# Patient Record
Sex: Female | Born: 1970 | Race: Black or African American | Hispanic: No | Marital: Single | Smoking: Never smoker
Health system: Southern US, Community
[De-identification: ages and names within clinical notes are randomized; demographics above are authoritative.]

---

## 2020-05-05 ENCOUNTER — Observation Stay (HOSPITAL_COMMUNITY): Payer: Medicaid Other

## 2020-05-05 ENCOUNTER — Other Ambulatory Visit: Payer: Self-pay

## 2020-05-05 ENCOUNTER — Encounter (HOSPITAL_COMMUNITY): Payer: Self-pay | Admitting: Emergency Medicine

## 2020-05-05 ENCOUNTER — Inpatient Hospital Stay (HOSPITAL_COMMUNITY)
Admission: EM | Admit: 2020-05-05 | Discharge: 2020-05-09 | DRG: 372 | Disposition: A | Discharge status: Home / Self Care | Payer: Medicaid Other | Attending: Infectious Disease | Admitting: Infectious Disease

## 2020-05-05 DIAGNOSIS — R112 Nausea with vomiting, unspecified: Secondary | ICD-10-CM | POA: Diagnosis present

## 2020-05-05 DIAGNOSIS — A020 Salmonella enteritis: Principal | ICD-10-CM | POA: Diagnosis present

## 2020-05-05 DIAGNOSIS — E869 Volume depletion, unspecified: Secondary | ICD-10-CM | POA: Diagnosis present

## 2020-05-05 DIAGNOSIS — Z20822 Contact with and (suspected) exposure to covid-19: Secondary | ICD-10-CM | POA: Diagnosis present

## 2020-05-05 DIAGNOSIS — E872 Acidosis: Secondary | ICD-10-CM | POA: Diagnosis present

## 2020-05-05 DIAGNOSIS — E876 Hypokalemia: Secondary | ICD-10-CM | POA: Diagnosis present

## 2020-05-05 DIAGNOSIS — N179 Acute kidney failure, unspecified: Secondary | ICD-10-CM | POA: Diagnosis not present

## 2020-05-05 DIAGNOSIS — K219 Gastro-esophageal reflux disease without esophagitis: Secondary | ICD-10-CM | POA: Diagnosis present

## 2020-05-05 DIAGNOSIS — Z8711 Personal history of peptic ulcer disease: Secondary | ICD-10-CM

## 2020-05-05 DIAGNOSIS — R197 Diarrhea, unspecified: Secondary | ICD-10-CM | POA: Diagnosis not present

## 2020-05-05 LAB — I-STAT BETA HCG BLOOD, ED (MC, WL, AP ONLY): I-stat hCG, quantitative: <5 m[IU]/mL (ref <5)

## 2020-05-05 LAB — COMPREHENSIVE METABOLIC PANEL
ALT: 20 U/L (ref 0–44)
AST: 39 U/L (ref 15–41)
Albumin: 3.4 g/dL — ABNORMAL LOW (ref 3.5–5.0)
Alkaline Phosphatase: 78 U/L (ref 38–126)
Anion gap: 14 (ref 5–15)
BUN: 28 mg/dL — ABNORMAL HIGH (ref 6–20)
CO2: 16 mmol/L — ABNORMAL LOW (ref 22–32)
Calcium: 9 mg/dL (ref 8.9–10.3)
Chloride: 104 mmol/L (ref 98–111)
Creatinine, Ser: 2.23 mg/dL — ABNORMAL HIGH (ref 0.44–1.00)
GFR calc Af Amer: 29 mL/min — ABNORMAL LOW (ref >60)
GFR calc non Af Amer: 25 mL/min — ABNORMAL LOW (ref >60)
Glucose, Bld: 129 mg/dL — ABNORMAL HIGH (ref 70–99)
Potassium: 3 mmol/L — ABNORMAL LOW (ref 3.5–5.1)
Sodium: 134 mmol/L — ABNORMAL LOW (ref 135–145)
Total Bilirubin: 0.8 mg/dL (ref 0.3–1.2)
Total Protein: 7.9 g/dL (ref 6.5–8.1)

## 2020-05-05 LAB — RAPID URINE DRUG SCREEN, HOSP PERFORMED
Amphetamines: NOT DETECTED
Barbiturates: NOT DETECTED
Benzodiazepines: NOT DETECTED
Cocaine: NOT DETECTED
Opiates: NOT DETECTED
Tetrahydrocannabinol: POSITIVE — AB

## 2020-05-05 LAB — URINALYSIS, ROUTINE W REFLEX MICROSCOPIC
Bacteria, UA: NONE SEEN
Bilirubin Urine: NEGATIVE
Glucose, UA: 50 mg/dL — AB
Ketones, ur: 20 mg/dL — AB
Leukocytes,Ua: NEGATIVE
Nitrite: NEGATIVE
Protein, ur: 100 mg/dL — AB
RBC / HPF: >50 RBC/hpf — ABNORMAL HIGH (ref 0–5)
Specific Gravity, Urine: 1.018 (ref 1.005–1.030)
pH: 6 (ref 5.0–8.0)

## 2020-05-05 LAB — LIPASE, BLOOD: Lipase: 23 U/L (ref 11–51)

## 2020-05-05 LAB — CBC
HCT: 43.7 % (ref 36.0–46.0)
Hemoglobin: 14.7 g/dL (ref 12.0–15.0)
MCH: 31 pg (ref 26.0–34.0)
MCHC: 33.6 g/dL (ref 30.0–36.0)
MCV: 92.2 fL (ref 80.0–100.0)
Platelets: 243 10*3/uL (ref 150–400)
RBC: 4.74 MIL/uL (ref 3.87–5.11)
RDW: 13.3 % (ref 11.5–15.5)
WBC: 10.8 10*3/uL — ABNORMAL HIGH (ref 4.0–10.5)
nRBC: 0 % (ref 0.0–0.2)

## 2020-05-05 LAB — C DIFFICILE QUICK SCREEN W PCR REFLEX
C Diff antigen: NEGATIVE
C Diff interpretation: NOT DETECTED
C Diff toxin: NEGATIVE

## 2020-05-05 LAB — SARS CORONAVIRUS 2 BY RT PCR (HOSPITAL ORDER, PERFORMED IN ~~LOC~~ HOSPITAL LAB): SARS Coronavirus 2: NEGATIVE

## 2020-05-05 MED ORDER — LACTATED RINGERS IV BOLUS
1000.0000 mL | Freq: Once | INTRAVENOUS | Status: AC
  Administered 2020-05-05: 1000 mL via INTRAVENOUS

## 2020-05-05 MED ORDER — POTASSIUM CHLORIDE 10 MEQ/100ML IV SOLN
10.0000 meq | Freq: Once | INTRAVENOUS | Status: AC
  Administered 2020-05-05: 10 meq via INTRAVENOUS
  Filled 2020-05-05: qty 100

## 2020-05-05 MED ORDER — OXYCODONE HCL 5 MG PO TABS
5.0000 mg | ORAL_TABLET | ORAL | Status: DC | PRN
  Administered 2020-05-05 – 2020-05-09 (×5): 5 mg via ORAL
  Filled 2020-05-05 (×5): qty 1

## 2020-05-05 MED ORDER — ONDANSETRON HCL 4 MG/2ML IJ SOLN
4.0000 mg | Freq: Once | INTRAMUSCULAR | Status: AC
  Administered 2020-05-05: 4 mg via INTRAVENOUS
  Filled 2020-05-05: qty 2

## 2020-05-05 MED ORDER — SODIUM CHLORIDE 0.9% FLUSH
3.0000 mL | Freq: Once | INTRAVENOUS | Status: AC
  Administered 2020-05-05: 3 mL via INTRAVENOUS

## 2020-05-05 MED ORDER — ACETAMINOPHEN 650 MG RE SUPP: 650.0000 mg | Freq: Four times a day (QID) | RECTAL | Status: DC | PRN

## 2020-05-05 MED ORDER — ONDANSETRON HCL 4 MG PO TABS
4.0000 mg | ORAL_TABLET | Freq: Four times a day (QID) | ORAL | Status: DC | PRN
  Administered 2020-05-06 – 2020-05-07 (×2): 4 mg via ORAL
  Filled 2020-05-05 (×3): qty 1

## 2020-05-05 MED ORDER — DICYCLOMINE HCL 10 MG PO CAPS
10.0000 mg | ORAL_CAPSULE | Freq: Once | ORAL | Status: AC
  Administered 2020-05-05: 10 mg via ORAL
  Filled 2020-05-05: qty 1

## 2020-05-05 MED ORDER — SODIUM CHLORIDE 0.45 % IV SOLN
INTRAVENOUS | Status: AC
  Filled 2020-05-05 (×9): qty 1000

## 2020-05-05 MED ORDER — ACETAMINOPHEN 325 MG PO TABS
650.0000 mg | ORAL_TABLET | Freq: Four times a day (QID) | ORAL | Status: DC | PRN
  Administered 2020-05-06 – 2020-05-08 (×5): 650 mg via ORAL
  Filled 2020-05-05 (×4): qty 2

## 2020-05-05 MED ORDER — LOPERAMIDE HCL 2 MG PO CAPS
2.0000 mg | ORAL_CAPSULE | ORAL | Status: DC | PRN
  Administered 2020-05-06 – 2020-05-07 (×4): 2 mg via ORAL
  Filled 2020-05-05 (×4): qty 1

## 2020-05-05 MED ORDER — ONDANSETRON HCL 4 MG/2ML IJ SOLN
4.0000 mg | Freq: Four times a day (QID) | INTRAMUSCULAR | Status: DC | PRN
  Administered 2020-05-08: 4 mg via INTRAVENOUS
  Filled 2020-05-05: qty 2

## 2020-05-05 NOTE — Progress Notes (Signed)
New Admission Note:   Arrival Method: Arrived from ED via stretcher Mental Orientation: Alert and oriented x4 Telemetry: Box #9 Assessment: Completed Skin: Intact IV: Rt AC Pain: Denies Tubes: N/A Safety Measures: Safety Fall Prevention Plan has been discussed.  Admission: Completed Orientation: Patient has been orientated to the room, unit and staff.  Family: None at bedside  Orders have been reviewed and implemented. Will continue to monitor the patient. Call light has been placed within reach and bed alarm has been activated.   Rex Oesterle [employer], RN-BC Phone number: [phone]

## 2020-05-05 NOTE — H&P (Addendum)
History and Physical    Hannalee Castor WUJ:[id number] DOB: 05-25-1971 DOA: 05/05/2020  PCP: System, Pcp Not In   Patient coming from: Home  I have personally briefly reviewed patient's old medical records in [place]  Chief Complaint: Diarrhea nauseous vomiting  HPI: Sophia Elliott is a 49 y.o. female with medical history significant of GERD, remote history of peptic ulcer with positive H. pylori status post treatment, presented with acute onset of nauseous vomit, abdominal cramping and diarrhea for 3 days.  Patient lives in [place] and went to [place] on weekend.  On Tuesday, about 1 hour after lunch when she ate a chicken gyro, she started to feel epigastric pain and soon she started to have diarrhea watery and she continues to have watery diarrhea through the day and night.  And episodic of abdominal cramping, she denies any tenesmus, and she had some feeling of chills and broke into sweating. Vomiting many times and could not eat or drink very well since strong feeling of nausea continued. She started to take OTC Imodium since yesterday with some what improvement of diarrhea and she had 2 diarrhea today so far. Abd pain also better but she still feels very weak and could hardly eat or drink anything. ED Course: AKI of Cr 2.2.  Review of Systems: As per HPI otherwise 10 point review of systems negative.    Past medical stress mentioned in HPI  Past surgical history: Lumbar spine surgery  Social history, denied any smoking drinking or drug use    No Known Allergies  Family history, denies any chronic problem such as diabetes in the family  Prior to Admission medications   Not on File    Physical Exam: Vitals:   05/05/20 1529 05/05/20 1530 05/05/20 1531 05/05/20 1700  BP:    110/79  Pulse: 96 98 91 86  Resp:      Temp:      TempSrc:      SpO2: 100% 99% 100% 97%  Weight:      Height:        Constitutional: NAD, calm, comfortable Vitals:   05/05/20  1529 05/05/20 1530 05/05/20 1531 05/05/20 1700  BP:    110/79  Pulse: 96 98 91 86  Resp:      Temp:      TempSrc:      SpO2: 100% 99% 100% 97%  Weight:      Height:       Eyes: PERRL, lids and conjunctivae normal ENMT: Mucous membranes are dry. Posterior pharynx clear of any exudate or lesions.Normal dentition.  Neck: normal, supple, no masses, no thyromegaly Respiratory: clear to auscultation bilaterally, no wheezing, no crackles. Normal respiratory effort. No accessory muscle use.  Cardiovascular: Regular rate and rhythm, no murmurs / rubs / gallops. No extremity edema. 2+ pedal pulses. No carotid bruits.  Abdomen: Mild tenderness on periumbilical area. No hepatosplenomegaly. Bowel sounds positive.  Musculoskeletal: no clubbing / cyanosis. No joint deformity upper and lower extremities. Good ROM, no contractures. Normal muscle tone.  Skin: no rashes, lesions, ulcers. No induration Neurologic: CN 2-12 grossly intact. Sensation intact, DTR normal. Strength 5/5 in all 4.  Psychiatric: Normal judgment and insight. Alert and oriented x 3. Normal mood.     Labs on Admission: I have personally reviewed following labs and imaging studies  CBC: Recent Labs  Lab 05/05/20 1000  WBC 10.8*  HGB 14.7  HCT 43.7  MCV 92.2  PLT 956   Basic Metabolic  Panel: Recent Labs  Lab 05/05/20 1000  NA 134*  K 3.0*  CL 104  CO2 16*  GLUCOSE 129*  BUN 28*  CREATININE 2.23*  CALCIUM 9.0   GFR: Estimated Creatinine Clearance: 37.4 mL/min (A) (by C-G formula based on SCr of 2.23 mg/dL (H)). Liver Function Tests: Recent Labs  Lab 05/05/20 1000  AST 39  ALT 20  ALKPHOS 78  BILITOT 0.8  PROT 7.9  ALBUMIN 3.4*   Recent Labs  Lab 05/05/20 1000  LIPASE 23   No results for input(s): AMMONIA in the last 168 hours. Coagulation Profile: No results for input(s): INR, PROTIME in the last 168 hours. Cardiac Enzymes: No results for input(s): CKTOTAL, CKMB, CKMBINDEX, TROPONINI in the last  168 hours. BNP (last 3 results) No results for input(s): PROBNP in the last 8760 hours. HbA1C: No results for input(s): HGBA1C in the last 72 hours. CBG: No results for input(s): GLUCAP in the last 168 hours. Lipid Profile: No results for input(s): CHOL, HDL, LDLCALC, TRIG, CHOLHDL, LDLDIRECT in the last 72 hours. Thyroid Function Tests: No results for input(s): TSH, T4TOTAL, FREET4, T3FREE, THYROIDAB in the last 72 hours. Anemia Panel: No results for input(s): VITAMINB12, FOLATE, FERRITIN, TIBC, IRON, RETICCTPCT in the last 72 hours. Urine analysis:    Component Value Date/Time   COLORURINE AMBER (A) 05/05/2020 1516   APPEARANCEUR CLOUDY (A) 05/05/2020 1516   LABSPEC 1.018 05/05/2020 1516   PHURINE 6.0 05/05/2020 1516   GLUCOSEU 50 (A) 05/05/2020 1516   HGBUR MODERATE (A) 05/05/2020 1516   BILIRUBINUR NEGATIVE 05/05/2020 1516   KETONESUR 20 (A) 05/05/2020 1516   PROTEINUR 100 (A) 05/05/2020 1516   NITRITE NEGATIVE 05/05/2020 1516   LEUKOCYTESUR NEGATIVE 05/05/2020 1516    Radiological Exams on Admission: No results found.  EKG: NOne  Assessment/Plan Active Problems:   AKI (acute kidney injury) (HCC)  AKI with non-anion gap metabolic acidosis -From severe volume depletion of ongoing diarrhea nauseous vomit, received 2 IV boluses in the ED, will start patient on the bicarb drip to correct bicarb level. Hopefully not a ATN. -Check renal ultrasound -BMP in the morning -Expect patient kidney function improved in next 24 hours and if her diarrhea also stabilized and p.o. picks up she likely can be discharged tomorrow.  Gastroenteritis -With both upper and lower GI symptoms with abdominal cramping, and acute onset, suspect food toxin, C diff ruled out. -No fever and borderline WBC elevation, and clinically she has had improved with less episodes of diarrhea, will hold off ABX and monitor CBC in AM. -IVF 150 ml/hr and PRN Imodium  Hypokalemia -From acidosis and continued GI  loss, replace and recheck   DVT prophylaxis: SCD Code Status: Full Family Communication: None Disposition Plan: Discharge barrier depends on patient clinical response to IV fluid and other supportive care and kidney function improvement, expect discharge in next 24 hours Consults called: None Admission status: Floor obs   Emeline General MD [facility] Pager [phone]   05/05/2020, 5:41 PM

## 2020-05-05 NOTE — Plan of Care (Signed)
  Problem: Activity: Goal: Risk for activity intolerance will decrease Outcome: Progressing   

## 2020-05-05 NOTE — ED Provider Notes (Signed)
[facility] EMERGENCY DEPARTMENT Provider Note   CSN: [id number] Arrival date & time: 05/05/20  0539     History Chief Complaint  Patient presents with  . Diarrhea  . Emesis    Sophia Elliott is a 49 y.o. female with no significant PMH who presents with N/V/D and abdominal pain. She states that she went to [place] with her boyfriend and ate a chicken gyro on Tuesday. Soon afterwards she started to have diarrhea and chills. The diarrhea is severe and watery. She cannot quantify how much she is having. Sometimes she can't make to the bathroom and so she's been wearing adult diapers. She has also had several episodes of vomiting but constant nausea. She threw up 3 times yesterday and once today. She has generalized abdominal cramping. She is taking Immodium and Pepcid without relief. No recent antibiotics. No URI symptoms, cough, chest pain or SOB. She is urinating normally. She felt so bad today due to fatigue and weakness she decided to come to the ED. She lives in [place] but her boyfriend lives here.   HPI     History reviewed. No pertinent past medical history.  There are no problems to display for this patient.   History reviewed. No pertinent surgical history.   OB History   No obstetric history on file.     No family history on file.  Social History   Tobacco Use  . Smoking status: Not on file  Substance Use Topics  . Alcohol use: Not on file  . Drug use: Not on file    Home Medications Prior to Admission medications   Not on File    Allergies    Patient has no known allergies.  Review of Systems   Review of Systems  Constitutional: Positive for activity change, appetite change, chills and fatigue. Negative for fever.  HENT: Negative for rhinorrhea and sore throat.   Respiratory: Negative for shortness of breath.   Cardiovascular: Negative for chest pain.  Gastrointestinal: Positive for abdominal pain, diarrhea, nausea and vomiting.    Genitourinary: Negative for dysuria and flank pain.  Neurological: Positive for light-headedness. Negative for syncope and headaches.  All other systems reviewed and are negative.   Physical Exam Updated Vital Signs BP 111/75   Pulse 96   Temp 97.7 F (36.5 C)   Resp 16   Ht 5\' 5"  (1.651 m)   Wt 104.3 kg   SpO2 99%   BMI 38.27 kg/m   Physical Exam Vitals and nursing note reviewed.  Constitutional:      General: She is not in acute distress.    Appearance: She is well-developed. She is obese. She is not ill-appearing.     Comments: Calm, cooperative. NAD  HENT:     Head: Normocephalic and atraumatic.  Eyes:     General: No scleral icterus.       Right eye: No discharge.        Left eye: No discharge.     Conjunctiva/sclera: Conjunctivae normal.     Pupils: Pupils are equal, round, and reactive to light.  Cardiovascular:     Rate and Rhythm: Regular rhythm. Tachycardia present.  Pulmonary:     Effort: Pulmonary effort is normal. No respiratory distress.     Breath sounds: Normal breath sounds.  Abdominal:     General: Bowel sounds are increased. There is no distension.     Palpations: Abdomen is soft.     Tenderness: There is no abdominal tenderness.  Musculoskeletal:     Cervical back: Normal range of motion.  Skin:    General: Skin is warm and dry.  Neurological:     Mental Status: She is alert and oriented to person, place, and time.  Psychiatric:        Behavior: Behavior normal.     ED Results / Procedures / Treatments   Labs (all labs ordered are listed, but only abnormal results are displayed) Labs Reviewed  COMPREHENSIVE METABOLIC PANEL - Abnormal; Notable for the following components:      Result Value   Sodium 134 (*)    Potassium 3.0 (*)    CO2 16 (*)    Glucose, Bld 129 (*)    BUN 28 (*)    Creatinine, Ser 2.23 (*)    Albumin 3.4 (*)    GFR calc non Af Amer 25 (*)    GFR calc Af Amer 29 (*)    All other components within normal limits   CBC - Abnormal; Notable for the following components:   WBC 10.8 (*)    All other components within normal limits  LIPASE, BLOOD  URINALYSIS, ROUTINE W REFLEX MICROSCOPIC  I-STAT BETA HCG BLOOD, ED (MC, WL, AP ONLY)    EKG None  Radiology No results found.  Procedures Procedures (including critical care time)  Medications Ordered in ED Medications  sodium chloride flush (NS) 0.9 % injection 3 mL (3 mLs Intravenous Given 05/05/20 1409)  lactated ringers bolus 1,000 mL (0 mLs Intravenous Stopped 05/05/20 1409)  ondansetron (ZOFRAN) injection 4 mg (4 mg Intravenous Given 05/05/20 1302)  potassium chloride 10 mEq in 100 mL IVPB (0 mEq Intravenous Stopped 05/05/20 1409)  lactated ringers bolus 1,000 mL (0 mLs Intravenous Stopped 05/05/20 1518)  dicyclomine (BENTYL) capsule 10 mg (10 mg Oral Given 05/05/20 1425)    ED Course  I have reviewed the triage vital signs and the nursing notes.  Pertinent labs & imaging results that were available during my care of the patient were reviewed by me and considered in my medical decision making (see chart for details).  49 year old female presents with nausea, vomiting, diarrhea and abdominal cramping after eating a chicken euro 3 days ago.  She has had profuse diarrhea and has not been able to take p.o.  She is alert and oriented and overall well-appearing although she does not feel well.  She is mildly tachycardic on initial exam.  CBC shows mild leukocytosis.  CMP shows hypokalemia, and acute renal failure (SCr 2.2). Will give fluids, Zofran, potassium  UA has 20 ketones. C diff is negative.  On recheck patient is able to tolerate a small amount of p.o. but it increases her abdominal cramping.  Due to concern that she will be able to rehydrate at home will request admission for observation.  Discussed with Dr. Chipper Herb with [facility] who will admit.  MDM Rules/Calculators/A&P                      Final Clinical Impression(s) / ED Diagnoses Final  diagnoses:  Nausea vomiting and diarrhea  AKI (acute kidney injury) (HCC)  Hypokalemia    Rx / DC Orders ED Discharge Orders    None       Bethel Born, PA-C 05/05/20 1637    Sabas Sous, MD 05/10/20 1146

## 2020-05-05 NOTE — ED Triage Notes (Signed)
Pt reports on Tuesday she ate a chicken sand which while visiting in [place]- pt states since then she has had constant abd cramping with diarrhea and vomiting. Pt states she is currently wearing depends due to constant diarrhea.  Pt now having body aching and cramping.

## 2020-05-05 NOTE — ED Notes (Signed)
Pt transported to US

## 2020-05-06 DIAGNOSIS — A020 Salmonella enteritis: Secondary | ICD-10-CM | POA: Diagnosis present

## 2020-05-06 DIAGNOSIS — E876 Hypokalemia: Secondary | ICD-10-CM | POA: Diagnosis present

## 2020-05-06 DIAGNOSIS — A029 Salmonella infection, unspecified: Secondary | ICD-10-CM | POA: Diagnosis not present

## 2020-05-06 DIAGNOSIS — N179 Acute kidney failure, unspecified: Secondary | ICD-10-CM | POA: Diagnosis present

## 2020-05-06 DIAGNOSIS — E872 Acidosis: Secondary | ICD-10-CM | POA: Diagnosis present

## 2020-05-06 DIAGNOSIS — R112 Nausea with vomiting, unspecified: Secondary | ICD-10-CM | POA: Diagnosis not present

## 2020-05-06 DIAGNOSIS — K219 Gastro-esophageal reflux disease without esophagitis: Secondary | ICD-10-CM | POA: Diagnosis present

## 2020-05-06 DIAGNOSIS — Z8711 Personal history of peptic ulcer disease: Secondary | ICD-10-CM | POA: Diagnosis not present

## 2020-05-06 DIAGNOSIS — R197 Diarrhea, unspecified: Secondary | ICD-10-CM | POA: Diagnosis not present

## 2020-05-06 DIAGNOSIS — E869 Volume depletion, unspecified: Secondary | ICD-10-CM | POA: Diagnosis present

## 2020-05-06 DIAGNOSIS — Z20822 Contact with and (suspected) exposure to covid-19: Secondary | ICD-10-CM | POA: Diagnosis present

## 2020-05-06 LAB — BASIC METABOLIC PANEL
Anion gap: 10 (ref 5–15)
Anion gap: 10 (ref 5–15)
BUN: 24 mg/dL — ABNORMAL HIGH (ref 6–20)
BUN: 18 mg/dL (ref 6–20)
CO2: 23 mmol/L (ref 22–32)
CO2: 25 mmol/L (ref 22–32)
Calcium: 8.2 mg/dL — ABNORMAL LOW (ref 8.9–10.3)
Calcium: 8.2 mg/dL — ABNORMAL LOW (ref 8.9–10.3)
Chloride: 101 mmol/L (ref 98–111)
Chloride: 99 mmol/L (ref 98–111)
Creatinine, Ser: 1.67 mg/dL — ABNORMAL HIGH (ref 0.44–1.00)
Creatinine, Ser: 1.59 mg/dL — ABNORMAL HIGH (ref 0.44–1.00)
GFR calc Af Amer: 42 mL/min — ABNORMAL LOW (ref >60)
GFR calc Af Amer: 44 mL/min — ABNORMAL LOW (ref >60)
GFR calc non Af Amer: 36 mL/min — ABNORMAL LOW (ref >60)
GFR calc non Af Amer: 38 mL/min — ABNORMAL LOW (ref >60)
Glucose, Bld: 106 mg/dL — ABNORMAL HIGH (ref 70–99)
Glucose, Bld: 94 mg/dL (ref 70–99)
Potassium: 2.4 mmol/L — CL (ref 3.5–5.1)
Potassium: 2.7 mmol/L — CL (ref 3.5–5.1)
Sodium: 134 mmol/L — ABNORMAL LOW (ref 135–145)
Sodium: 134 mmol/L — ABNORMAL LOW (ref 135–145)

## 2020-05-06 LAB — CBC
HCT: 33.9 % — ABNORMAL LOW (ref 36.0–46.0)
Hemoglobin: 11.8 g/dL — ABNORMAL LOW (ref 12.0–15.0)
MCH: 31.6 pg (ref 26.0–34.0)
MCHC: 34.8 g/dL (ref 30.0–36.0)
MCV: 90.9 fL (ref 80.0–100.0)
Platelets: 215 10*3/uL (ref 150–400)
RBC: 3.73 MIL/uL — ABNORMAL LOW (ref 3.87–5.11)
RDW: 13.3 % (ref 11.5–15.5)
WBC: 7.2 10*3/uL (ref 4.0–10.5)
nRBC: 0 % (ref 0.0–0.2)

## 2020-05-06 LAB — PHOSPHORUS: Phosphorus: 2.3 mg/dL — ABNORMAL LOW (ref 2.5–4.6)

## 2020-05-06 LAB — HIV ANTIBODY (ROUTINE TESTING W REFLEX): HIV Screen 4th Generation wRfx: NONREACTIVE

## 2020-05-06 LAB — MAGNESIUM: Magnesium: 1.8 mg/dL (ref 1.7–2.4)

## 2020-05-06 MED ORDER — POTASSIUM CHLORIDE CRYS ER 20 MEQ PO TBCR
40.0000 meq | EXTENDED_RELEASE_TABLET | ORAL | Status: DC
  Administered 2020-05-06: 40 meq via ORAL
  Filled 2020-05-06: qty 2

## 2020-05-06 MED ORDER — POTASSIUM CHLORIDE CRYS ER 20 MEQ PO TBCR: 40.0000 meq | EXTENDED_RELEASE_TABLET | Freq: Once | ORAL | Status: DC

## 2020-05-06 MED ORDER — POTASSIUM CHLORIDE CRYS ER 20 MEQ PO TBCR
40.0000 meq | EXTENDED_RELEASE_TABLET | ORAL | Status: AC
  Administered 2020-05-06 (×2): 40 meq via ORAL
  Filled 2020-05-06: qty 2

## 2020-05-06 MED ORDER — POTASSIUM CHLORIDE CRYS ER 20 MEQ PO TBCR
40.0000 meq | EXTENDED_RELEASE_TABLET | Freq: Once | ORAL | Status: AC
  Administered 2020-05-06: 40 meq via ORAL
  Filled 2020-05-06: qty 2

## 2020-05-06 NOTE — Progress Notes (Signed)
PROGRESS NOTE    Sophia Elliott  OVZ:[id number] DOB: 01/17/1971 DOA: 05/05/2020 PCP: System, Pcp Not In    Chief Complaint  Patient presents with  . Diarrhea  . Emesis    Brief Narrative:  Sophia Elliott is a 49 y.o. female with medical history significant of GERD, remote history of peptic ulcer with positive H. pylori status post treatment, presented with acute onset of nauseous vomit, abdominal cramping and diarrhea for 3 days.  Patient lives in [place] and went to [place] on weekend.  On Tuesday, about 1 hour after lunch when she ate a chicken gyro, she started to feel epigastric pain and soon she started to have diarrhea watery and she continued to have watery diarrhea through the day and night.  And episodic of abdominal cramping, she denies any tenesmus, and she had some feeling of chills and broke into sweating.  She was unable to tolerate any p.o.  Abdominal pain improved but she continued to have diarrhea.  Patient states she lost count of how many stools she had last night.  Denies having any blood in the stool.  ED Course: AKI of Cr 2.2.   Assessment & Plan:   Active Problems:   AKI (acute kidney injury) (HCC)   Nausea vomiting and diarrhea  AKI with non-anion gap metabolic acidosis -From severe volume depletion of ongoing diarrhea, vomiting. Received 2 IV boluses in the ED.  She was given IV bicarb. -Renal ultrasound unremarkable. -Continue IV hydration.  Continue to monitor BMP.  Avoid nephrotoxic medications.  BMP in the morning  Gastroenteritis -With both upper and lower GI symptoms with abdominal cramping, and acute onset, suspect food toxin, C diff ruled out.  GI pathogen panel pending at this time. -No fever and borderline WBC elevation.  Hold off on antibiotics at this time.  Hypokalemia -From acidosis and continued GI loss.  Replacement ordered.  Continue to monitor and replace as needed.  Would also check magnesium level.   DVT prophylaxis:  SCD Code Status: Full Family Communication: None Disposition Plan: Discharge barrier depends on patient clinical response to IV fluid and other supportive care and kidney function improvement Consults called: None Status is: Inpatient  Remains inpatient appropriate because:Persistent severe electrolyte disturbances and IV treatments appropriate due to intensity of illness or inability to take PO   Dispo: The patient is from: Home              Anticipated d/c is to: Home              Anticipated d/c date is: 2 days              Patient currently is not medically stable to d/c.   Consultants:   None   Procedures:  None   Antimicrobials:   None    Subjective: She says she is continuing to have diarrhea and she lost count of how many bowel movements she had last night.  She has some nausea but no vomiting.  Potassium this morning 2.7.  Objective: Vitals:   05/05/20 2012 05/06/20 0031 05/06/20 0523 05/06/20 0814  BP:  102/66 123/71 104/66  Pulse:  84 88 85  Resp:  16 16 18   Temp:  98.6 F (37 C) 98.8 F (37.1 C) 98.9 F (37.2 C)  TempSrc:      SpO2:  100% 98% 100%  Weight: 103.8 kg     Height: 5\' 5"  (1.651 m)       Intake/Output Summary (Last  24 hours) at 05/06/2020 1620 Last data filed at 05/06/2020 1300 Gross per 24 hour  Intake 2704.96 ml  Output 12 ml  Net 2692.96 ml   Filed Weights   05/05/20 0948 05/05/20 2012  Weight: 104.3 kg 103.8 kg    Examination:  General exam: Appears calm and comfortable  Respiratory system: Clear to auscultation. Respiratory effort normal. Cardiovascular system: S1 & S2 heard, RRR. No JVD, murmurs, rubs, gallops or clicks. No pedal edema. Gastrointestinal system: Abdomen is nondistended, mild nonspecific tenderness without any guarding or rebound. No organomegaly or masses felt. Normal bowel sounds heard. Central nervous system: Alert and oriented. No focal neurological deficits. Extremities: Symmetric 5 x 5 power. Skin: No  rashes, lesions or ulcers Psychiatry: Judgement and insight appear normal. Mood & affect appropriate.     Data Reviewed: I have personally reviewed following labs and imaging studies  CBC: Recent Labs  Lab 05/05/20 1000 05/06/20 0722  WBC 10.8* 7.2  HGB 14.7 11.8*  HCT 43.7 33.9*  MCV 92.2 90.9  PLT 243 215    Basic Metabolic Panel: Recent Labs  Lab 05/05/20 1000 05/06/20 0722  NA 134* 134*  K 3.0* 2.4*  CL 104 101  CO2 16* 23  GLUCOSE 129* 106*  BUN 28* 24*  CREATININE 2.23* 1.67*  CALCIUM 9.0 8.2*  MG  --  1.8  PHOS  --  2.3*    GFR: Estimated Creatinine Clearance: 49.8 mL/min (A) (by C-G formula based on SCr of 1.67 mg/dL (H)).  Liver Function Tests: Recent Labs  Lab 05/05/20 1000  AST 39  ALT 20  ALKPHOS 78  BILITOT 0.8  PROT 7.9  ALBUMIN 3.4*    CBG: No results for input(s): GLUCAP in the last 168 hours.   Recent Results (from the past 240 hour(s))  C Difficile Quick Screen w PCR reflex     Status: None   Collection Time: 05/05/20  3:15 PM   Specimen: Stool  Result Value Ref Range Status   C Diff antigen NEGATIVE NEGATIVE Final   C Diff toxin NEGATIVE NEGATIVE Final   C Diff interpretation No C. difficile detected.  Final    Comment: Performed at [facility], [address] [place], [place] [address]  SARS Coronavirus 2 by RT PCR (hospital order, performed in [facility] hospital lab) Nasopharyngeal Nasopharyngeal Swab     Status: None   Collection Time: 05/05/20  4:50 PM   Specimen: Nasopharyngeal Swab  Result Value Ref Range Status   SARS Coronavirus 2 NEGATIVE NEGATIVE Final    Comment: (NOTE) SARS-CoV-2 target nucleic acids are NOT DETECTED. The SARS-CoV-2 RNA is generally detectable in upper and lower respiratory specimens during the acute phase of infection. The lowest concentration of SARS-CoV-2 viral copies this assay can detect is 250 copies / mL. A negative result does not preclude SARS-CoV-2 infection and should not be  used as the sole basis for treatment or other patient management decisions.  A negative result may occur with improper specimen collection / handling, submission of specimen other than nasopharyngeal swab, presence of viral mutation(s) within the areas targeted by this assay, and inadequate number of viral copies (<250 copies / mL). A negative result must be combined with clinical observations, patient history, and epidemiological information. Fact Sheet for Patients:   BoilerBrush.com.cy Fact Sheet for Healthcare Providers: https://pope.com/ This test is not yet approved or cleared  by the [place] FDA and has been authorized for detection and/or diagnosis of SARS-CoV-2 by FDA  under an Emergency Use Authorization (EUA).  This EUA will remain in effect (meaning this test can be used) for the duration of the COVID-19 declaration under Section 564(b)(1) of the Act, 21 U.S.C. section 360bbb-3(b)(1), unless the authorization is terminated or revoked sooner. Performed at [facility], [place] [address], [place], [place] [address]          Radiology Studies: US RENAL  Result Date: 05/05/2020 CLINICAL DATA:  Acute renal insufficiency EXAM: RENAL / URINARY TRACT ULTRASOUND COMPLETE COMPARISON:  None. FINDINGS: Right Kidney: Renal measurements: 11.8 x 5.3 by 6.0 cm = volume: 197 mL . Echogenicity within normal limits. No mass or hydronephrosis visualized. Left Kidney: Renal measurements: 11.4 x 7.4 x 7.2 cm = volume: 317 mL. Echogenicity within normal limits. No mass or hydronephrosis visualized. Bladder: Appears normal for degree of bladder distention. Other: None. IMPRESSION: 1. Unremarkable renal ultrasound. Electronically Signed   By: Randa Ngo M.D.   On: 05/05/2020 19:22        Scheduled Meds: Continuous Infusions: . sodium chloride 0.45 % 1,000 mL with sodium bicarbonate 75 mEq infusion 150 mL/hr at 05/06/20 1134      LOS: 0 days    Yaakov Guthrie, MD [facility]   To contact the attending provider between 7A-7P or the covering provider during after hours 7P-7A, please log into the web site [email or web address] and access using universal [place] password for that web site. If you do not have the password, please call the hospital operator.  05/06/2020, 4:20 PM

## 2020-05-06 NOTE — Progress Notes (Signed)
CRITICAL VALUE ALERT  Critical Value:  2.7  Date & Time Notied:05/06/20 @1625   Provider Notified: Dr 

## 2020-05-07 DIAGNOSIS — A029 Salmonella infection, unspecified: Secondary | ICD-10-CM

## 2020-05-07 LAB — GASTROINTESTINAL PANEL BY PCR, STOOL (REPLACES STOOL CULTURE)

## 2020-05-07 LAB — MAGNESIUM: Magnesium: 1.9 mg/dL (ref 1.7–2.4)

## 2020-05-07 LAB — CBC
HCT: 32.3 % — ABNORMAL LOW (ref 36.0–46.0)
Hemoglobin: 11.1 g/dL — ABNORMAL LOW (ref 12.0–15.0)
MCH: 31.6 pg (ref 26.0–34.0)
MCHC: 34.4 g/dL (ref 30.0–36.0)
MCV: 92 fL (ref 80.0–100.0)
Platelets: 216 10*3/uL (ref 150–400)
RBC: 3.51 MIL/uL — ABNORMAL LOW (ref 3.87–5.11)
RDW: 13.3 % (ref 11.5–15.5)
WBC: 5.4 10*3/uL (ref 4.0–10.5)
nRBC: 0 % (ref 0.0–0.2)

## 2020-05-07 LAB — BASIC METABOLIC PANEL
Anion gap: 9 (ref 5–15)
BUN: 16 mg/dL (ref 6–20)
CO2: 24 mmol/L (ref 22–32)
Calcium: 8.2 mg/dL — ABNORMAL LOW (ref 8.9–10.3)
Chloride: 102 mmol/L (ref 98–111)
Creatinine, Ser: 1.57 mg/dL — ABNORMAL HIGH (ref 0.44–1.00)
GFR calc Af Amer: 45 mL/min — ABNORMAL LOW (ref >60)
GFR calc non Af Amer: 39 mL/min — ABNORMAL LOW (ref >60)
Glucose, Bld: 99 mg/dL (ref 70–99)
Potassium: 3.1 mmol/L — ABNORMAL LOW (ref 3.5–5.1)
Sodium: 135 mmol/L (ref 135–145)

## 2020-05-07 MED ORDER — CIPROFLOXACIN HCL 500 MG PO TABS
500.0000 mg | ORAL_TABLET | Freq: Two times a day (BID) | ORAL | Status: DC
  Administered 2020-05-07 – 2020-05-09 (×6): 500 mg via ORAL
  Filled 2020-05-07 (×6): qty 1

## 2020-05-07 MED ORDER — ENOXAPARIN SODIUM 40 MG/0.4ML ~~LOC~~ SOLN
40.0000 mg | SUBCUTANEOUS | Status: DC
  Filled 2020-05-07 (×2): qty 0.4

## 2020-05-07 MED ORDER — SODIUM CHLORIDE 0.9 % IV SOLN: INTRAVENOUS | Status: DC

## 2020-05-07 MED ORDER — POTASSIUM CHLORIDE CRYS ER 20 MEQ PO TBCR
40.0000 meq | EXTENDED_RELEASE_TABLET | Freq: Once | ORAL | Status: AC
  Administered 2020-05-07: 40 meq via ORAL
  Filled 2020-05-07: qty 2

## 2020-05-07 NOTE — Plan of Care (Signed)
  Problem: Education: Goal: Knowledge of General Education information will improve Description Including pain rating scale, medication(s)/side effects and non-pharmacologic comfort measures Outcome: Progressing   

## 2020-05-07 NOTE — Plan of Care (Signed)

## 2020-05-07 NOTE — Progress Notes (Signed)
PROGRESS NOTE    Sophia Elliott  JEH:[id number] DOB: 03-10-1971 DOA: 05/05/2020 PCP: System, Pcp Not In    Chief Complaint  Patient presents with  . Diarrhea  . Emesis    Brief Narrative:  Sophia Elliott is a 49 y.o. female with medical history significant of GERD, remote history of peptic ulcer with positive H. pylori status post treatment, presented with acute onset of nauseous vomit, abdominal cramping and diarrhea for 3 days.  Patient lives in [place] and went to [place] on weekend.  On Tuesday, about 1 hour after lunch when she ate a chicken gyro, she started to feel epigastric pain and soon she started to have diarrhea watery and she continued to have watery diarrhea through the day and night.  And episodic of abdominal cramping, she denies any tenesmus, and she had some feeling of chills and broke into sweating.  She was unable to tolerate any p.o.  Abdominal pain improved but she continued to have diarrhea. Denies having any blood in the stool.  GI pathogen panel was positive for Salmonella.  ED Course: AKI of Cr 2.2.   Assessment & Plan:   Active Problems:   AKI (acute kidney injury) (HCC)   Nausea vomiting and diarrhea  AKI with non-anion gap metabolic acidosis -From severe volume depletion of ongoing diarrhea, vomiting. Received 2 IV boluses in the ED.  She was given IV bicarb. -Renal ultrasound unremarkable. -Continue IV hydration.  Continue to monitor BMP.  Avoid nephrotoxic medications.  Continue to monitor BMP.  Gastroenteritis -With both upper and lower GI symptoms with abdominal cramping, and acute onset, suspect food toxin, C diff ruled out.  GI pathogen panel pending positive for Salmonella. -She is continuing to have multiple episodes of diarrhea despite being on Imodium. -Started on ciprofloxacin.  Given that the stool was positive for Salmonella will hold Imodium.  Hypokalemia -From acidosis and continued GI loss.  Replacement ordered.  Continue  to monitor and replace as needed.  Magnesium level 1.9.   DVT prophylaxis: SCD Code Status: Full Family Communication: None Disposition Plan: Discharge barrier depends on patient clinical response to IV fluid and other supportive care and kidney function improvement Consults called: None Status is: Inpatient  Remains inpatient appropriate because:Persistent severe electrolyte disturbances and IV treatments appropriate due to intensity of illness or inability to take PO   Dispo: The patient is from: Home              Anticipated d/c is to: Home              Anticipated d/c date is: 2 days              Patient currently is not medically stable to d/c.   Consultants:   None   Procedures:  None   Antimicrobials:   None    Subjective: She says she is continuing to have diarrhea with abdominal cramping.  Denies having any blood in the stool.  GI pathogen panel was positive for Salmonella.  Objective: Vitals:   05/06/20 1902 05/06/20 2130 05/07/20 0646 05/07/20 0959  BP: 118/76 116/80 102/60 106/72  Pulse: 85 86 81 76  Resp: 18 16 20 18   Temp: 98.8 F (37.1 C) 98.3 F (36.8 C) 98.4 F (36.9 C) 98.1 F (36.7 C)  TempSrc:  Oral Oral Oral  SpO2: 99% 100% 96% 100%  Weight:  102.8 kg    Height:        Intake/Output Summary (Last 24 hours)  at 05/07/2020 1617 Last data filed at 05/07/2020 1400 Gross per 24 hour  Intake 2044.38 ml  Output 4 ml  Net 2040.38 ml   Filed Weights   05/05/20 0948 05/05/20 2012 05/06/20 2130  Weight: 104.3 kg 103.8 kg 102.8 kg    Examination:  General exam: Appears calm and comfortable  Respiratory system: Clear to auscultation. Respiratory effort normal. Cardiovascular system: S1 & S2 heard, RRR. No JVD, murmurs, rubs, gallops or clicks. No pedal edema. Gastrointestinal system: Abdomen is nondistended, mild nonspecific tenderness without any guarding or rebound. No organomegaly or masses felt. Normal bowel sounds heard. Central  nervous system: Alert and oriented. No focal neurological deficits. Extremities: Symmetric 5 x 5 power. Skin: No rashes, lesions or ulcers Psychiatry: Judgement and insight appear normal. Mood & affect appropriate.     Data Reviewed: I have personally reviewed following labs and imaging studies  CBC: Recent Labs  Lab 05/05/20 1000 05/06/20 0722 05/07/20 0423  WBC 10.8* 7.2 5.4  HGB 14.7 11.8* 11.1*  HCT 43.7 33.9* 32.3*  MCV 92.2 90.9 92.0  PLT 243 215 216    Basic Metabolic Panel: Recent Labs  Lab 05/05/20 1000 05/06/20 0722 05/06/20 1554 05/07/20 0423  NA 134* 134* 134* 135  K 3.0* 2.4* 2.7* 3.1*  CL 104 101 99 102  CO2 16* 23 25 24   GLUCOSE 129* 106* 94 99  BUN 28* 24* 18 16  CREATININE 2.23* 1.67* 1.59* 1.57*  CALCIUM 9.0 8.2* 8.2* 8.2*  MG  --  1.8  --  1.9  PHOS  --  2.3*  --   --     GFR: Estimated Creatinine Clearance: 52.7 mL/min (A) (by C-G formula based on SCr of 1.57 mg/dL (H)).  Liver Function Tests: Recent Labs  Lab 05/05/20 1000  AST 39  ALT 20  ALKPHOS 78  BILITOT 0.8  PROT 7.9  ALBUMIN 3.4*    CBG: No results for input(s): GLUCAP in the last 168 hours.   Recent Results (from the past 240 hour(s))  Gastrointestinal Panel by PCR , Stool     Status: Abnormal   Collection Time: 05/05/20  3:15 PM   Specimen: Stool  Result Value Ref Range Status   Campylobacter species NOT DETECTED NOT DETECTED Final   Plesimonas shigelloides NOT DETECTED NOT DETECTED Final   Salmonella species DETECTED (A) NOT DETECTED Final    Comment: RESULT CALLED TO, READ BACK BY AND VERIFIED WITH: TINA ISAACS 05/07/20 AT 0105 HS    Yersinia enterocolitica NOT DETECTED NOT DETECTED Final   Vibrio species NOT DETECTED NOT DETECTED Final   Vibrio cholerae NOT DETECTED NOT DETECTED Final   Enteroaggregative E coli (EAEC) NOT DETECTED NOT DETECTED Final   Enteropathogenic E coli (EPEC) NOT DETECTED NOT DETECTED Final   Enterotoxigenic E coli (ETEC) NOT DETECTED  NOT DETECTED Final   Shiga like toxin producing E coli (STEC) NOT DETECTED NOT DETECTED Final   Shigella/Enteroinvasive E coli (EIEC) NOT DETECTED NOT DETECTED Final   Cryptosporidium NOT DETECTED NOT DETECTED Final   Cyclospora cayetanensis NOT DETECTED NOT DETECTED Final   Entamoeba histolytica NOT DETECTED NOT DETECTED Final   Giardia lamblia NOT DETECTED NOT DETECTED Final   Adenovirus F40/41 NOT DETECTED NOT DETECTED Final   Astrovirus NOT DETECTED NOT DETECTED Final   Norovirus GI/GII NOT DETECTED NOT DETECTED Final   Rotavirus A NOT DETECTED NOT DETECTED Final   Sapovirus (I, II, IV, and V) NOT DETECTED NOT DETECTED Final    Comment: Performed  at [facility], [place], [place] [address]  C Difficile Quick Screen w PCR reflex     Status: None   Collection Time: 05/05/20  3:15 PM   Specimen: Stool  Result Value Ref Range Status   C Diff antigen NEGATIVE NEGATIVE Final   C Diff toxin NEGATIVE NEGATIVE Final   C Diff interpretation No C. difficile detected.  Final    Comment: Performed at [facility], [place] [address], [place], [place] [address]  SARS Coronavirus 2 by RT PCR (hospital order, performed in [facility] hospital lab) Nasopharyngeal Nasopharyngeal Swab     Status: None   Collection Time: 05/05/20  4:50 PM   Specimen: Nasopharyngeal Swab  Result Value Ref Range Status   SARS Coronavirus 2 NEGATIVE NEGATIVE Final    Comment: (NOTE) SARS-CoV-2 target nucleic acids are NOT DETECTED. The SARS-CoV-2 RNA is generally detectable in upper and lower respiratory specimens during the acute phase of infection. The lowest concentration of SARS-CoV-2 viral copies this assay can detect is 250 copies / mL. A negative result does not preclude SARS-CoV-2 infection and should not be used as the sole basis for treatment or other patient management decisions.  A negative result may occur with improper specimen collection / handling, submission of specimen  other than nasopharyngeal swab, presence of viral mutation(s) within the areas targeted by this assay, and inadequate number of viral copies (<250 copies / mL). A negative result must be combined with clinical observations, patient history, and epidemiological information. Fact Sheet for Patients:   StrictlyIdeas.no Fact Sheet for Healthcare Providers: BankingDealers.co.za This test is not yet approved or cleared  by the [place] FDA and has been authorized for detection and/or diagnosis of SARS-CoV-2 by FDA under an Emergency Use Authorization (EUA).  This EUA will remain in effect (meaning this test can be used) for the duration of the COVID-19 declaration under Section 564(b)(1) of the Act, 21 U.S.C. section 360bbb-3(b)(1), unless the authorization is terminated or revoked sooner. Performed at [facility], [place] [address], [place], [place] [address]          Radiology Studies: US RENAL  Result Date: 05/05/2020 CLINICAL DATA:  Acute renal insufficiency EXAM: RENAL / URINARY TRACT ULTRASOUND COMPLETE COMPARISON:  None. FINDINGS: Right Kidney: Renal measurements: 11.8 x 5.3 by 6.0 cm = volume: 197 mL . Echogenicity within normal limits. No mass or hydronephrosis visualized. Left Kidney: Renal measurements: 11.4 x 7.4 x 7.2 cm = volume: 317 mL. Echogenicity within normal limits. No mass or hydronephrosis visualized. Bladder: Appears normal for degree of bladder distention. Other: None. IMPRESSION: 1. Unremarkable renal ultrasound. Electronically Signed   By: Randa Ngo M.D.   On: 05/05/2020 19:22        Scheduled Meds: . ciprofloxacin  500 mg Oral BID   Continuous Infusions: . sodium chloride 125 mL/hr at 05/07/20 1054     LOS: 1 day    Yaakov Guthrie, MD [facility]   To contact the attending provider between 7A-7P or the covering provider during after hours 7P-7A, please log into the web site  [email or web address] and access using universal [place] password for that web site. If you do not have the password, please call the hospital operator.  05/07/2020, 4:17 PM

## 2020-05-08 DIAGNOSIS — A020 Salmonella enteritis: Principal | ICD-10-CM

## 2020-05-08 LAB — BASIC METABOLIC PANEL
Anion gap: 8 (ref 5–15)
BUN: 9 mg/dL (ref 6–20)
CO2: 22 mmol/L (ref 22–32)
Calcium: 8 mg/dL — ABNORMAL LOW (ref 8.9–10.3)
Chloride: 105 mmol/L (ref 98–111)
Creatinine, Ser: 1.4 mg/dL — ABNORMAL HIGH (ref 0.44–1.00)
GFR calc Af Amer: 52 mL/min — ABNORMAL LOW (ref >60)
GFR calc non Af Amer: 45 mL/min — ABNORMAL LOW (ref >60)
Glucose, Bld: 141 mg/dL — ABNORMAL HIGH (ref 70–99)
Potassium: 2.9 mmol/L — ABNORMAL LOW (ref 3.5–5.1)
Sodium: 135 mmol/L (ref 135–145)

## 2020-05-08 LAB — CBC
HCT: 30.9 % — ABNORMAL LOW (ref 36.0–46.0)
Hemoglobin: 10.4 g/dL — ABNORMAL LOW (ref 12.0–15.0)
MCH: 31.4 pg (ref 26.0–34.0)
MCHC: 33.7 g/dL (ref 30.0–36.0)
MCV: 93.4 fL (ref 80.0–100.0)
Platelets: 229 10*3/uL (ref 150–400)
RBC: 3.31 MIL/uL — ABNORMAL LOW (ref 3.87–5.11)
RDW: 13.5 % (ref 11.5–15.5)
WBC: 5.7 10*3/uL (ref 4.0–10.5)
nRBC: 0 % (ref 0.0–0.2)

## 2020-05-08 MED ORDER — POTASSIUM CHLORIDE CRYS ER 20 MEQ PO TBCR
40.0000 meq | EXTENDED_RELEASE_TABLET | ORAL | Status: AC
  Administered 2020-05-08 (×3): 40 meq via ORAL
  Filled 2020-05-08 (×3): qty 2

## 2020-05-08 NOTE — Progress Notes (Signed)
PROGRESS NOTE    Sophia Elliott  UVO:[id number] DOB: 01/08/1971 DOA: 05/05/2020 PCP: System, Pcp Not In    Chief Complaint  Patient presents with  . Diarrhea  . Emesis    Brief Narrative:  Sophia Elliott is a 49 y.o. female with medical history significant of GERD, remote history of peptic ulcer with positive H. pylori status post treatment, presented with acute onset of nauseous vomiting, abdominal cramping and diarrhea for 3 days.  Patient lives in [place] and went to [place] on weekend.  On Tuesday, about 1 hour after lunch when she ate a chicken gyro, she started to feel epigastric pain and soon she started to have diarrhea watery and she continued to have watery diarrhea through the day and night.  And episodic of abdominal cramping, she denies any tenesmus, and she had some feeling of chills and broke into sweating.  She was unable to tolerate any p.o.  Abdominal pain improved but she continued to have diarrhea. Denies having any blood in the stool.  GI pathogen panel was positive for Salmonella.  ED Course: AKI of Cr 2.2.   Assessment & Plan:   Active Problems:   AKI (acute kidney injury) (HCC)   Nausea vomiting and diarrhea  AKI with non-anion gap metabolic acidosis -From severe volume depletion of ongoing diarrhea, vomiting. Received 2 IV boluses in the ED.  She was given IV bicarb. -Renal ultrasound unremarkable. -Improving with IV hydration.  Continue to monitor BMP.  Avoid nephrotoxic medications.  Continue to monitor BMP.  Gastroenteritis -With both upper and lower GI symptoms with abdominal cramping, and acute onset, suspect food toxin, C diff ruled out.  GI pathogen panel positive for Salmonella. -She is continuing to have multiple episodes of diarrhea. -Continue on ciprofloxacin.  Given that the stool was positive for Salmonella will hold Imodium.  Severe electrolyte abnormalities/hypokalemia -From continued GI loss.  Replacement ordered.  Continue to  monitor and replace as needed.  Magnesium level 1.9.   DVT prophylaxis:  Subcutaneous heparin Code Status: Full Family Communication: None Disposition Plan: Discharge barrier depends on patient clinical response to IV fluid and other supportive care and kidney function improvement Consults called: None Status is: Inpatient  Remains inpatient appropriate because:Persistent severe electrolyte disturbances and IV treatments appropriate due to intensity of illness or inability to take PO   Dispo: The patient is from: Home              Anticipated d/c is to: Home              Anticipated d/c date is: 2 days              Patient currently is not medically stable to d/c.   Consultants:   None   Procedures:  None   Antimicrobials:   None    Subjective: She is continuing to have several bowel movements throughout the night and this morning. Denies having any blood in the stool.  GI pathogen panel was positive for Salmonella.  Potassium also keeps dropping.  Objective: Vitals:   05/07/20 0959 05/07/20 1710 05/07/20 2223 05/08/20 0646  BP: 106/72 106/71 108/68 98/68  Pulse: 76 78 82 79  Resp: 18 18 18 16   Temp: 98.1 F (36.7 C) 98 F (36.7 C) 99 F (37.2 C) 98.3 F (36.8 C)  TempSrc: Oral Oral Oral Oral  SpO2: 100% 99% 99% 97%  Weight:   102.8 kg   Height:  Intake/Output Summary (Last 24 hours) at 05/08/2020 0907 Last data filed at 05/08/2020 0600 Gross per 24 hour  Intake 2841.96 ml  Output 3 ml  Net 2838.96 ml   Filed Weights   05/05/20 2012 05/06/20 2130 05/07/20 2223  Weight: 103.8 kg 102.8 kg 102.8 kg    Examination:  General exam: Appears calm and comfortable  Respiratory system: Clear to auscultation. Respiratory effort normal. Cardiovascular system: S1 & S2 heard, RRR. No JVD, murmurs, rubs, gallops or clicks. No pedal edema. Gastrointestinal system: Abdomen is nondistended, mild nonspecific tenderness without any guarding or rebound. No  organomegaly or masses felt. Normal bowel sounds heard. Central nervous system: Alert and oriented. No focal neurological deficits. Extremities: Symmetric 5 x 5 power. Skin: No rashes, lesions or ulcers Psychiatry: Judgement and insight appear normal. Mood & affect appropriate.     Data Reviewed: I have personally reviewed following labs and imaging studies  CBC: Recent Labs  Lab 05/05/20 1000 05/06/20 0722 05/07/20 0423 05/08/20 0532  WBC 10.8* 7.2 5.4 5.7  HGB 14.7 11.8* 11.1* 10.4*  HCT 43.7 33.9* 32.3* 30.9*  MCV 92.2 90.9 92.0 93.4  PLT 243 215 216 229    Basic Metabolic Panel: Recent Labs  Lab 05/05/20 1000 05/06/20 0722 05/06/20 1554 05/07/20 0423 05/08/20 0532  NA 134* 134* 134* 135 135  K 3.0* 2.4* 2.7* 3.1* 2.9*  CL 104 101 99 102 105  CO2 16* 23 25 24 22   GLUCOSE 129* 106* 94 99 141*  BUN 28* 24* 18 16 9   CREATININE 2.23* 1.67* 1.59* 1.57* 1.40*  CALCIUM 9.0 8.2* 8.2* 8.2* 8.0*  MG  --  1.8  --  1.9  --   PHOS  --  2.3*  --   --   --     GFR: Estimated Creatinine Clearance: 59.1 mL/min (A) (by C-G formula based on SCr of 1.4 mg/dL (H)).  Liver Function Tests: Recent Labs  Lab 05/05/20 1000  AST 39  ALT 20  ALKPHOS 78  BILITOT 0.8  PROT 7.9  ALBUMIN 3.4*    CBG: No results for input(s): GLUCAP in the last 168 hours.   Recent Results (from the past 240 hour(s))  Gastrointestinal Panel by PCR , Stool     Status: Abnormal   Collection Time: 05/05/20  3:15 PM   Specimen: Stool  Result Value Ref Range Status   Campylobacter species NOT DETECTED NOT DETECTED Final   Plesimonas shigelloides NOT DETECTED NOT DETECTED Final   Salmonella species DETECTED (A) NOT DETECTED Final    Comment: RESULT CALLED TO, READ BACK BY AND VERIFIED WITH: TINA ISAACS 05/07/20 AT 0105 HS    Yersinia enterocolitica NOT DETECTED NOT DETECTED Final   Vibrio species NOT DETECTED NOT DETECTED Final   Vibrio cholerae NOT DETECTED NOT DETECTED Final    Enteroaggregative E coli (EAEC) NOT DETECTED NOT DETECTED Final   Enteropathogenic E coli (EPEC) NOT DETECTED NOT DETECTED Final   Enterotoxigenic E coli (ETEC) NOT DETECTED NOT DETECTED Final   Shiga like toxin producing E coli (STEC) NOT DETECTED NOT DETECTED Final   Shigella/Enteroinvasive E coli (EIEC) NOT DETECTED NOT DETECTED Final   Cryptosporidium NOT DETECTED NOT DETECTED Final   Cyclospora cayetanensis NOT DETECTED NOT DETECTED Final   Entamoeba histolytica NOT DETECTED NOT DETECTED Final   Giardia lamblia NOT DETECTED NOT DETECTED Final   Adenovirus F40/41 NOT DETECTED NOT DETECTED Final   Astrovirus NOT DETECTED NOT DETECTED Final   Norovirus GI/GII NOT DETECTED NOT DETECTED  Final   Rotavirus A NOT DETECTED NOT DETECTED Final   Sapovirus (I, II, IV, and V) NOT DETECTED NOT DETECTED Final    Comment: Performed at [facility], [place], [place] [address]  C Difficile Quick Screen w PCR reflex     Status: None   Collection Time: 05/05/20  3:15 PM   Specimen: Stool  Result Value Ref Range Status   C Diff antigen NEGATIVE NEGATIVE Final   C Diff toxin NEGATIVE NEGATIVE Final   C Diff interpretation No C. difficile detected.  Final    Comment: Performed at [facility], [place] [address], [place], [place] [address]  SARS Coronavirus 2 by RT PCR (hospital order, performed in [facility] hospital lab) Nasopharyngeal Nasopharyngeal Swab     Status: None   Collection Time: 05/05/20  4:50 PM   Specimen: Nasopharyngeal Swab  Result Value Ref Range Status   SARS Coronavirus 2 NEGATIVE NEGATIVE Final    Comment: (NOTE) SARS-CoV-2 target nucleic acids are NOT DETECTED. The SARS-CoV-2 RNA is generally detectable in upper and lower respiratory specimens during the acute phase of infection. The lowest concentration of SARS-CoV-2 viral copies this assay can detect is 250 copies / mL. A negative result does not preclude SARS-CoV-2 infection and should not be  used as the sole basis for treatment or other patient management decisions.  A negative result may occur with improper specimen collection / handling, submission of specimen other than nasopharyngeal swab, presence of viral mutation(s) within the areas targeted by this assay, and inadequate number of viral copies (<250 copies / mL). A negative result must be combined with clinical observations, patient history, and epidemiological information. Fact Sheet for Patients:   StrictlyIdeas.no Fact Sheet for Healthcare Providers: BankingDealers.co.za This test is not yet approved or cleared  by the [place] FDA and has been authorized for detection and/or diagnosis of SARS-CoV-2 by FDA under an Emergency Use Authorization (EUA).  This EUA will remain in effect (meaning this test can be used) for the duration of the COVID-19 declaration under Section 564(b)(1) of the Act, 21 U.S.C. section 360bbb-3(b)(1), unless the authorization is terminated or revoked sooner. Performed at [facility], [place] [address] [place], [place] [address]          Radiology Studies: No results found.      Scheduled Meds: . ciprofloxacin  500 mg Oral BID  . enoxaparin (LOVENOX) injection  40 mg Subcutaneous Q24H  . potassium chloride  40 mEq Oral Q2H   Continuous Infusions: . sodium chloride 125 mL/hr at 05/08/20 0322     LOS: 2 days    Yaakov Guthrie, MD [facility]   To contact the attending provider between 7A-7P or the covering provider during after hours 7P-7A, please log into the web site [email or web address] and access using universal [place] password for that web site. If you do not have the password, please call the hospital operator.  05/08/2020, 9:07 AM

## 2020-05-09 ENCOUNTER — Encounter (HOSPITAL_COMMUNITY): Payer: Self-pay | Admitting: Internal Medicine

## 2020-05-09 ENCOUNTER — Encounter (HOSPITAL_COMMUNITY): Payer: Self-pay

## 2020-05-09 LAB — BASIC METABOLIC PANEL
Anion gap: 8 (ref 5–15)
BUN: 5 mg/dL — ABNORMAL LOW (ref 6–20)
CO2: 21 mmol/L — ABNORMAL LOW (ref 22–32)
Calcium: 8.2 mg/dL — ABNORMAL LOW (ref 8.9–10.3)
Chloride: 108 mmol/L (ref 98–111)
Creatinine, Ser: 1.2 mg/dL — ABNORMAL HIGH (ref 0.44–1.00)
GFR calc Af Amer: >60 mL/min (ref >60)
GFR calc non Af Amer: 54 mL/min — ABNORMAL LOW (ref >60)
Glucose, Bld: 107 mg/dL — ABNORMAL HIGH (ref 70–99)
Potassium: 3.5 mmol/L (ref 3.5–5.1)
Sodium: 137 mmol/L (ref 135–145)

## 2020-05-09 LAB — CBC
HCT: 30.6 % — ABNORMAL LOW (ref 36.0–46.0)
Hemoglobin: 10.1 g/dL — ABNORMAL LOW (ref 12.0–15.0)
MCH: 31.4 pg (ref 26.0–34.0)
MCHC: 33 g/dL (ref 30.0–36.0)
MCV: 95 fL (ref 80.0–100.0)
Platelets: 236 10*3/uL (ref 150–400)
RBC: 3.22 MIL/uL — ABNORMAL LOW (ref 3.87–5.11)
RDW: 13.6 % (ref 11.5–15.5)
WBC: 6.3 10*3/uL (ref 4.0–10.5)
nRBC: 0 % (ref 0.0–0.2)

## 2020-05-09 LAB — MAGNESIUM: Magnesium: 1.6 mg/dL — ABNORMAL LOW (ref 1.7–2.4)

## 2020-05-09 MED ORDER — CIPROFLOXACIN HCL 500 MG PO TABS: 500.0000 mg | ORAL_TABLET | Freq: Two times a day (BID) | ORAL | 0 refills | Status: AC

## 2020-05-09 MED ORDER — POTASSIUM CHLORIDE CRYS ER 20 MEQ PO TBCR
40.0000 meq | EXTENDED_RELEASE_TABLET | Freq: Once | ORAL | Status: AC
  Administered 2020-05-09: 40 meq via ORAL
  Filled 2020-05-09: qty 2

## 2020-05-09 MED ORDER — MAGNESIUM SULFATE 2 GM/50ML IV SOLN
2.0000 g | Freq: Once | INTRAVENOUS | Status: AC
  Administered 2020-05-09: 2 g via INTRAVENOUS
  Filled 2020-05-09 (×2): qty 50

## 2020-05-09 NOTE — Progress Notes (Signed)
Discharge instructions reviewed with patient and all questions answered.  Work note and prescription given to patient.  All personal belongings sent with the patient.  Burnard Bunting RN

## 2020-05-09 NOTE — Discharge Summary (Signed)
Physician Discharge Summary  Jeremiah Tarpley PYP:[id number] DOB: 07-28-1971 DOA: 05/05/2020  PCP: System, Pcp Not In  Admit date: 05/05/2020 Discharge date: 05/09/2020  Admitted From: Home Disposition:  Home  Recommendations for Outpatient Follow-up:  1. Follow up with PCP in 1-2 weeks 2. Please obtain BMP/CBC in one week  Home Health: No  Equipment/Devices: No  Discharge Condition: Stable CODE STATUS: Full Diet recommendation: Regular   Brief/Interim Summary: Sophia Guizar Terryis a 49 y.o.femalewith medical history significant ofGERD, remote history of peptic ulcer with positive H. pylori status post treatment, presented with acute onset of nauseous vomiting, abdominal cramping and diarrhea for 3 days. Patient lives in [place] and went to [place] on weekend. About 1 hour after lunch when she ate a chicken gyro,she started to feel epigastric pain and soon she started to have diarrhea watery and she continued to have watery diarrhea through the day and night.  She also had episodic abdominal cramping, she denies any tenesmus, and she had some feeling of chills and broke into sweating.  She was unable to tolerate any p.o.  Abdominal pain improved but she continued to have diarrhea. Denies having any blood in the stool.  GI pathogen panel was positive for Salmonella.  Lab work in the ER showed acute kidney injury with elevated creatinine 2.23.  Following is the hospital course in the problem list format: AKIwith non-anion gap metabolic acidosis -From severe volume depletion of ongoing diarrhea, vomiting.Received 2 IV boluses in the ED.  She was given IV bicarb. -Renal ultrasound unremarkable. -Creatinine improved from 2.23 to 1.2 today. -She is wanting to be discharged home.  She is advised to take oral fluids to keep herself hydrated.  Gastroenteritis -With both upper and lower GI symptoms with abdominal cramping, and acute onset,suspect food toxin, C diff ruled out.  GI  pathogen panel positive for Salmonella. -Through the hospitalization she continued to have multiple episodes of diarrhea which is now improving. -She is able to tolerate p.o.  She says abdominal pain is also improving.  She will be discharged on ciprofloxacin to complete the course.  Severe electrolyte abnormalities/hypokalemia -From continued GI loss. Potassium finally normalized.  Magnesium was also replaced.  Discharge Diagnoses:  Active Problems:   AKI (acute kidney injury) (HCC)   Nausea vomiting and diarrhea    Discharge Instructions Discharge plan of care discussed with the patient.  Answered questions appropriately to the best of my knowledge.  She is being discharged home in a stable condition.  Allergies as of 05/09/2020      Reactions   Lactose Intolerance (gi)       Medication List    STOP taking these medications   acetaminophen 500 MG tablet Commonly known as: TYLENOL     TAKE these medications   ciprofloxacin 500 MG tablet Commonly known as: CIPRO Take 1 tablet (500 mg total) by mouth 2 (two) times daily for 3 days. Start taking on: May 10, 2020   lansoprazole 15 MG capsule Commonly known as: PREVACID Take 15 mg by mouth daily at 12 noon.       Allergies  Allergen Reactions  . Lactose Intolerance (Gi)     Consultations:  None   Procedures/Studies: US RENAL  Result Date: 05/05/2020 CLINICAL DATA:  Acute renal insufficiency EXAM: RENAL / URINARY TRACT ULTRASOUND COMPLETE COMPARISON:  None. FINDINGS: Right Kidney: Renal measurements: 11.8 x 5.3 by 6.0 cm = volume: 197 mL . Echogenicity within normal limits. No mass or hydronephrosis visualized. Left Kidney:  Renal measurements: 11.4 x 7.4 x 7.2 cm = volume: 317 mL. Echogenicity within normal limits. No mass or hydronephrosis visualized. Bladder: Appears normal for degree of bladder distention. Other: None. IMPRESSION: 1. Unremarkable renal ultrasound. Electronically Signed   By: Sharlet Salina M.D.    On: 05/05/2020 19:22      Subjective: Patient says that her diarrhea is improving.  She states her abdominal pain also improved.  She is able to tolerate p.o.  She is wanting to be discharged home.  Discharge Exam: Vitals:   05/09/20 0436 05/09/20 0916  BP: 99/67 106/68  Pulse: 74 75  Resp: 18 16  Temp: 98.8 F (37.1 C) 98.8 F (37.1 C)  SpO2: 100% 100%   Vitals:   05/08/20 1829 05/08/20 2033 05/09/20 0436 05/09/20 0916  BP: 121/81 115/84 99/67 106/68  Pulse: 87 85 74 75  Resp: 18 16 18 16   Temp: 98.4 F (36.9 C) 99 F (37.2 C) 98.8 F (37.1 C) 98.8 F (37.1 C)  TempSrc:  Oral Oral Oral  SpO2: 100% 100% 100% 100%  Weight:  106.9 kg    Height:        General: Pt is alert, awake, not in acute distress Cardiovascular: RRR, S1/S2 +, no rubs, no gallops Respiratory: CTA bilaterally, no wheezing, no rhonchi Abdominal: Soft, NT, ND, bowel sounds + Extremities: no edema, no cyanosis    The results of significant diagnostics from this hospitalization (including imaging, microbiology, ancillary and laboratory) are listed below for reference.     Microbiology: Recent Results (from the past 240 hour(s))  Gastrointestinal Panel by PCR , Stool     Status: Abnormal   Collection Time: 05/05/20  3:15 PM   Specimen: Stool  Result Value Ref Range Status   Campylobacter species NOT DETECTED NOT DETECTED Final   Plesimonas shigelloides NOT DETECTED NOT DETECTED Final   Salmonella species DETECTED (A) NOT DETECTED Final    Comment: RESULT CALLED TO, READ BACK BY AND VERIFIED WITH: TINA ISAACS 05/07/20 AT 0105 HS    Yersinia enterocolitica NOT DETECTED NOT DETECTED Final   Vibrio species NOT DETECTED NOT DETECTED Final   Vibrio cholerae NOT DETECTED NOT DETECTED Final   Enteroaggregative E coli (EAEC) NOT DETECTED NOT DETECTED Final   Enteropathogenic E coli (EPEC) NOT DETECTED NOT DETECTED Final   Enterotoxigenic E coli (ETEC) NOT DETECTED NOT DETECTED Final   Shiga like  toxin producing E coli (STEC) NOT DETECTED NOT DETECTED Final   Shigella/Enteroinvasive E coli (EIEC) NOT DETECTED NOT DETECTED Final   Cryptosporidium NOT DETECTED NOT DETECTED Final   Cyclospora cayetanensis NOT DETECTED NOT DETECTED Final   Entamoeba histolytica NOT DETECTED NOT DETECTED Final   Giardia lamblia NOT DETECTED NOT DETECTED Final   Adenovirus F40/41 NOT DETECTED NOT DETECTED Final   Astrovirus NOT DETECTED NOT DETECTED Final   Norovirus GI/GII NOT DETECTED NOT DETECTED Final   Rotavirus A NOT DETECTED NOT DETECTED Final   Sapovirus (I, II, IV, and V) NOT DETECTED NOT DETECTED Final    Comment: Performed at [facility], [address] [place], [place]  C Difficile Quick Screen w PCR reflex     Status: None   Collection Time: 05/05/20  3:15 PM   Specimen: Stool  Result Value Ref Range Status   C Diff antigen NEGATIVE NEGATIVE Final   C Diff toxin NEGATIVE NEGATIVE Final   C Diff interpretation No C. difficile detected.  Final    Comment: Performed at [facility], [address] [place], [place] [address]  SARS Coronavirus 2 by RT PCR (hospital order, performed [facility]) Nasopharyngeal Nasopharyngeal Swab     Status: None   Collection Time: 05/05/20  4:50 PM   Specimen: Nasopharyngeal Swab  Result Value Ref Range Status   SARS Coronavirus 2 NEGATIVE NEGATIVE Final    Comment: (NOTE) SARS-CoV-2 target nucleic acids are NOT DETECTED. The SARS-CoV-2 RNA is generally detectable in upper and lower respiratory specimens during the acute phase of infection. The lowest concentration of SARS-CoV-2 viral copies this assay can detect is 250 copies / mL. A negative result does not preclude SARS-CoV-2 infection and should not be used as the sole basis for treatment or other patient management decisions.  A negative result may occur with improper specimen collection / handling, submission of specimen other than nasopharyngeal swab,  presence of viral mutation(s) within the areas targeted by this assay, and inadequate number of viral copies (<250 copies / mL). A negative result must be combined with clinical observations, patient history, and epidemiological information. Fact Sheet for Patients:   BoilerBrush.com.cyhttps://www.fda.gov/media/136312/download Fact Sheet for Healthcare Providers: https://pope.com/https://www.fda.gov/media/136313/download This test is not yet approved or cleared  by the [place] FDA and has been authorized for detection and/or diagnosis of SARS-CoV-2 by FDA under an Emergency Use Authorization (EUA).  This EUA will remain in effect (meaning this test can be used) for the duration of the COVID-19 declaration under Section 564(b)(1) of the Act, 21 U.S.C. section 360bbb-3(b)(1), unless the authorization is terminated or revoked sooner. Performed at [facility], [address] [place], [place] [address]      Labs: BNP (last 3 results) No results for input(s): BNP in the last 8760 hours. Basic Metabolic Panel: Recent Labs  Lab 05/06/20 0722 05/06/20 1554 05/07/20 0423 05/08/20 0532 05/09/20 0553  NA 134* 134* 135 135 137  K 2.4* 2.7* 3.1* 2.9* 3.5  CL 101 99 102 105 108  CO2 23 25 24 22  21*  GLUCOSE 106* 94 99 141* 107*  BUN 24* 18 16 9  5*  CREATININE 1.67* 1.59* 1.57* 1.40* 1.20*  CALCIUM 8.2* 8.2* 8.2* 8.0* 8.2*  MG 1.8  --  1.9  --  1.6*  PHOS 2.3*  --   --   --   --    Liver Function Tests: Recent Labs  Lab 05/05/20 1000  AST 39  ALT 20  ALKPHOS 78  BILITOT 0.8  PROT 7.9  ALBUMIN 3.4*   Recent Labs  Lab 05/05/20 1000  LIPASE 23   No results for input(s): AMMONIA in the last 168 hours. CBC: Recent Labs  Lab 05/05/20 1000 05/06/20 0722 05/07/20 0423 05/08/20 0532 05/09/20 0553  WBC 10.8* 7.2 5.4 5.7 6.3  HGB 14.7 11.8* 11.1* 10.4* 10.1*  HCT 43.7 33.9* 32.3* 30.9* 30.6*  MCV 92.2 90.9 92.0 93.4 95.0  PLT 243 215 216 229 236   Cardiac Enzymes: No results for input(s):  CKTOTAL, CKMB, CKMBINDEX, TROPONINI in the last 168 hours. BNP: Invalid input(s): POCBNP CBG: No results for input(s): GLUCAP in the last 168 hours. D-Dimer No results for input(s): DDIMER in the last 72 hours. Hgb A1c No results for input(s): HGBA1C in the last 72 hours. Lipid Profile No results for input(s): CHOL, HDL, LDLCALC, TRIG, CHOLHDL, LDLDIRECT in the last 72 hours. Thyroid function studies No results for input(s): TSH, T4TOTAL, T3FREE, THYROIDAB in the last 72 hours.  Invalid input(s): FREET3 Anemia work up No results  for input(s): VITAMINB12, FOLATE, FERRITIN, TIBC, IRON, RETICCTPCT in the last 72 hours. Urinalysis    Component Value Date/Time   COLORURINE AMBER (A) 05/05/2020 1516   APPEARANCEUR CLOUDY (A) 05/05/2020 1516   LABSPEC 1.018 05/05/2020 1516   PHURINE 6.0 05/05/2020 1516   GLUCOSEU 50 (A) 05/05/2020 1516   HGBUR MODERATE (A) 05/05/2020 1516   BILIRUBINUR NEGATIVE 05/05/2020 1516   KETONESUR 20 (A) 05/05/2020 1516   PROTEINUR 100 (A) 05/05/2020 1516   NITRITE NEGATIVE 05/05/2020 1516   LEUKOCYTESUR NEGATIVE 05/05/2020 1516   Sepsis Labs Invalid input(s): PROCALCITONIN,  WBC,  LACTICIDVEN Microbiology Recent Results (from the past 240 hour(s))  Gastrointestinal Panel by PCR , Stool     Status: Abnormal   Collection Time: 05/05/20  3:15 PM   Specimen: Stool  Result Value Ref Range Status   Campylobacter species NOT DETECTED NOT DETECTED Final   Plesimonas shigelloides NOT DETECTED NOT DETECTED Final   Salmonella species DETECTED (A) NOT DETECTED Final    Comment: RESULT CALLED TO, READ BACK BY AND VERIFIED WITH: TINA ISAACS 05/07/20 AT 0105 HS    Yersinia enterocolitica NOT DETECTED NOT DETECTED Final   Vibrio species NOT DETECTED NOT DETECTED Final   Vibrio cholerae NOT DETECTED NOT DETECTED Final   Enteroaggregative E coli (EAEC) NOT DETECTED NOT DETECTED Final   Enteropathogenic E coli (EPEC) NOT DETECTED NOT DETECTED Final   Enterotoxigenic  E coli (ETEC) NOT DETECTED NOT DETECTED Final   Shiga like toxin producing E coli (STEC) NOT DETECTED NOT DETECTED Final   Shigella/Enteroinvasive E coli (EIEC) NOT DETECTED NOT DETECTED Final   Cryptosporidium NOT DETECTED NOT DETECTED Final   Cyclospora cayetanensis NOT DETECTED NOT DETECTED Final   Entamoeba histolytica NOT DETECTED NOT DETECTED Final   Giardia lamblia NOT DETECTED NOT DETECTED Final   Adenovirus F40/41 NOT DETECTED NOT DETECTED Final   Astrovirus NOT DETECTED NOT DETECTED Final   Norovirus GI/GII NOT DETECTED NOT DETECTED Final   Rotavirus A NOT DETECTED NOT DETECTED Final   Sapovirus (I, II, IV, and V) NOT DETECTED NOT DETECTED Final    Comment: Performed at [facility], [address] [place], [place] [address]  C Difficile Quick Screen w PCR reflex     Status: None   Collection Time: 05/05/20  3:15 PM   Specimen: Stool  Result Value Ref Range Status   C Diff antigen NEGATIVE NEGATIVE Final   C Diff toxin NEGATIVE NEGATIVE Final   C Diff interpretation No C. difficile detected.  Final    Comment: Performed at [facility], [address] [place], [place] [address]  SARS Coronavirus 2 by RT PCR (hospital order, performed in [facility] hospital lab) Nasopharyngeal Nasopharyngeal Swab     Status: None   Collection Time: 05/05/20  4:50 PM   Specimen: Nasopharyngeal Swab  Result Value Ref Range Status   SARS Coronavirus 2 NEGATIVE NEGATIVE Final    Comment: (NOTE) SARS-CoV-2 target nucleic acids are NOT DETECTED. The SARS-CoV-2 RNA is generally detectable in upper and lower respiratory specimens during the acute phase of infection. The lowest concentration of SARS-CoV-2 viral copies this assay can detect is 250 copies / mL. A negative result does not preclude SARS-CoV-2 infection and should not be used as the sole basis for treatment or other patient management decisions.  A negative result may occur with improper specimen collection / handling,  submission of specimen other than nasopharyngeal swab, presence of viral mutation(s) within the areas targeted by  this assay, and inadequate number of viral copies (<250 copies / mL). A negative result must be combined with clinical observations, patient history, and epidemiological information. Fact Sheet for Patients:   BoilerBrush.com.cy Fact Sheet for Healthcare Providers: https://pope.com/ This test is not yet approved or cleared  by the [place] FDA and has been authorized for detection and/or diagnosis of SARS-CoV-2 by FDA under an Emergency Use Authorization (EUA).  This EUA will remain in effect (meaning this test can be used) for the duration of the COVID-19 declaration under Section 564(b)(1) of the Act, 21 U.S.C. section 360bbb-3(b)(1), unless the authorization is terminated or revoked sooner. Performed at [facility], [address] [place], [place] [address]      Time coordinating discharge: Over 30 minutes  SIGNED:   Vonzella Nipple, MD  [facility] 05/09/2020, 3:14 PM Pager on [phone]  If 7PM-7AM, please contact night-coverage [email or web address] Password [email or web address]

## 2021-12-03 IMAGING — US US RENAL
1 series · 14 of 25 positions shown · non-contrast
Comparison: None.

CLINICAL DATA: Acute renal insufficiency

EXAM:
RENAL / URINARY TRACT ULTRASOUND COMPLETE

[Series 1: us renal · 14 of 38 slices shown]
[im 1/38]
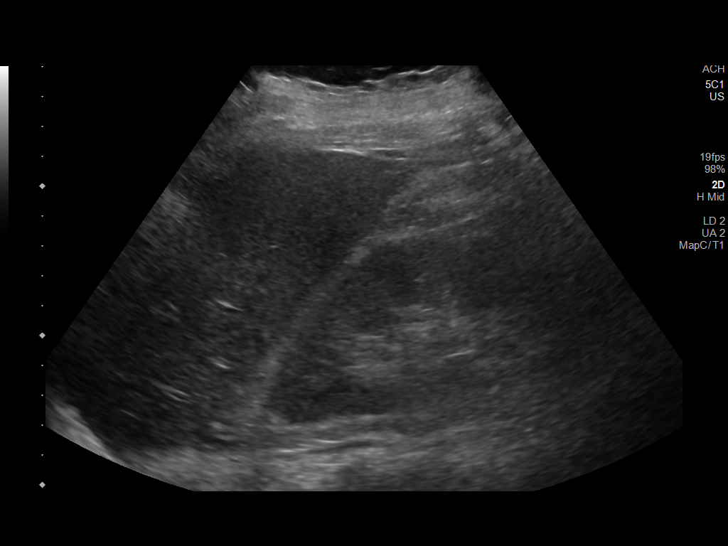
[im 4/38]
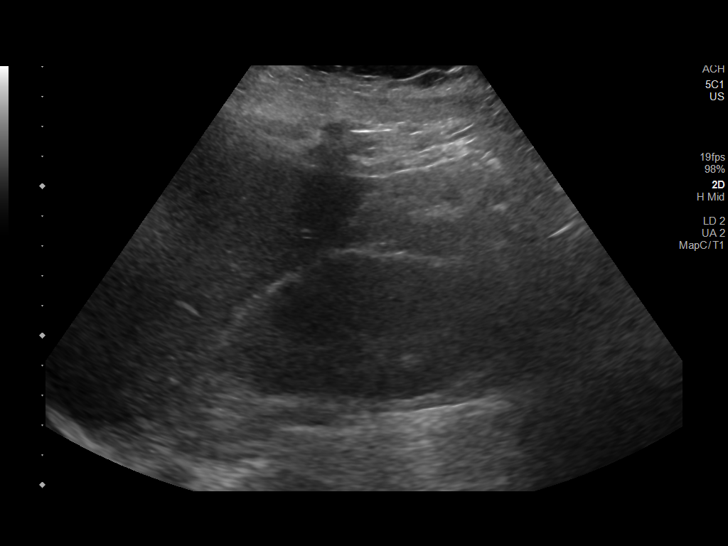
[im 7/38]
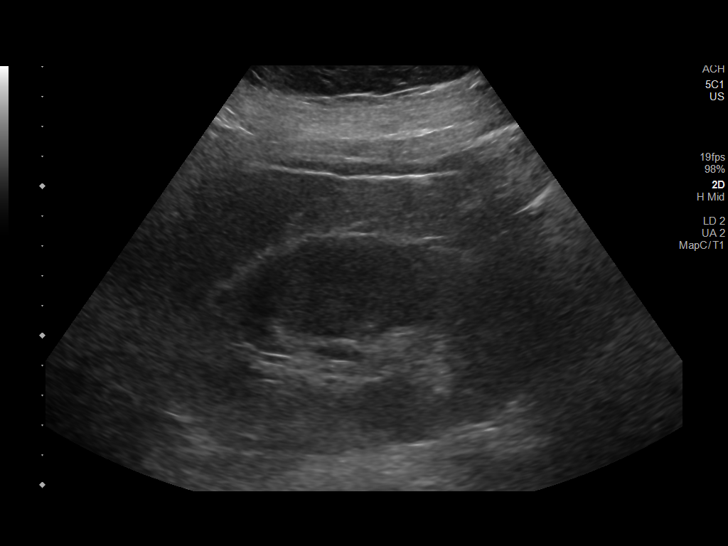
[im 10/38]
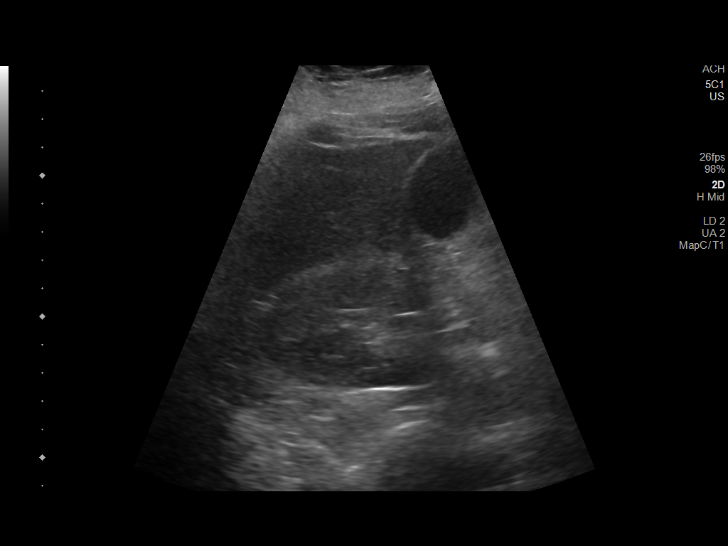
[im 13/38]
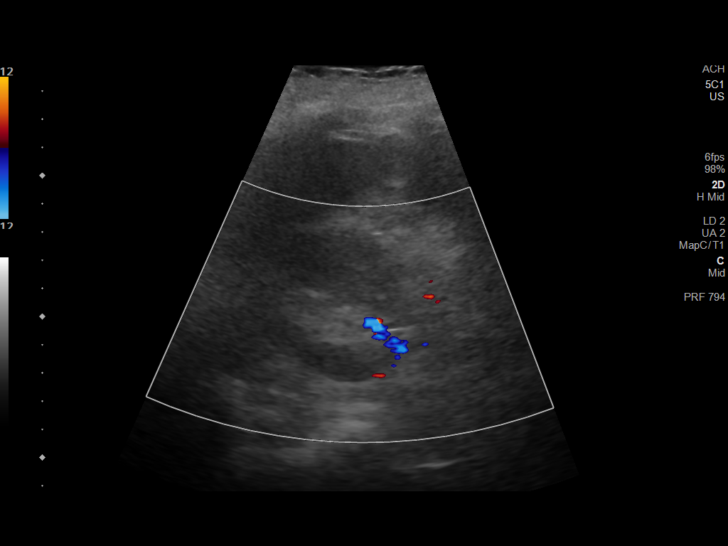
[im 14/38]
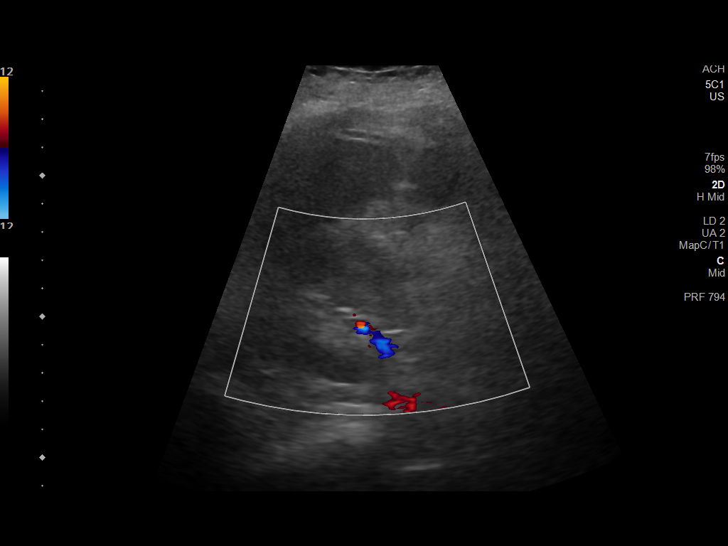
[im 17/38]
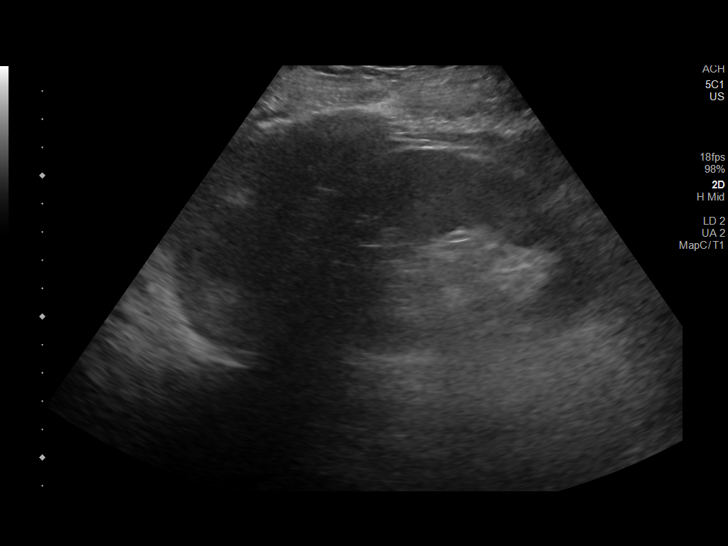
[im 21/38]
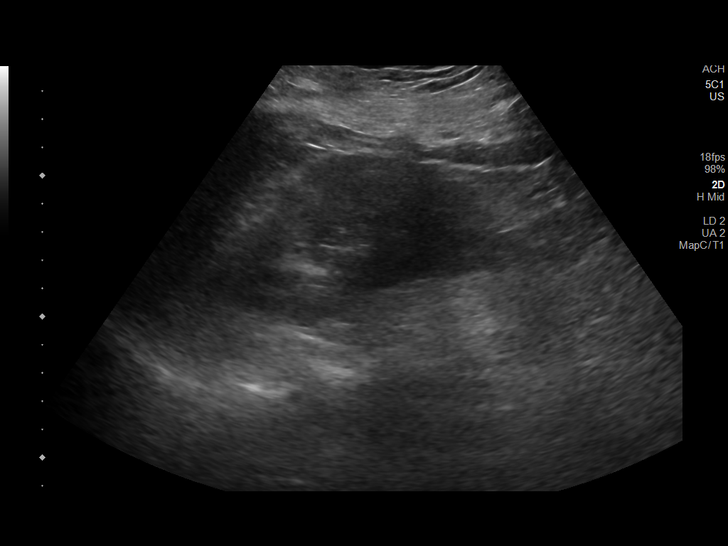
[im 24/38]
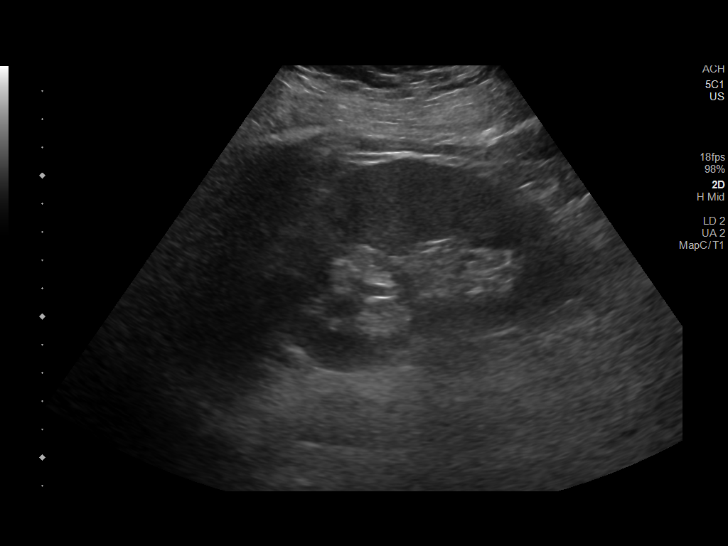
[im 25/38]
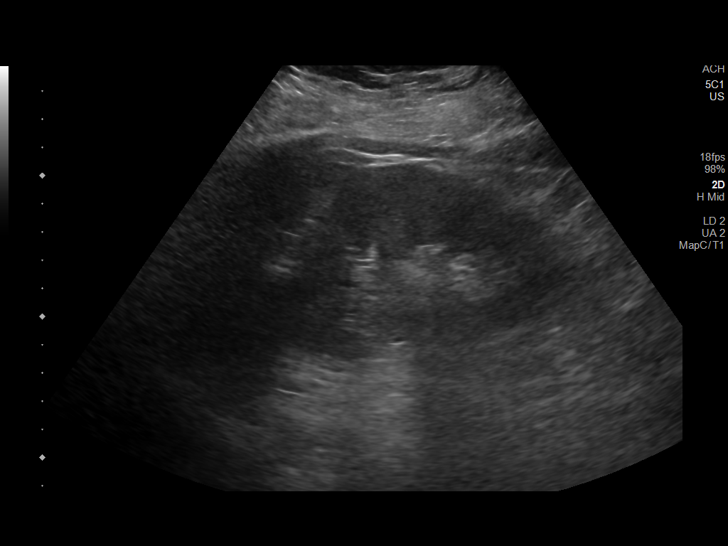
[im 28/38]
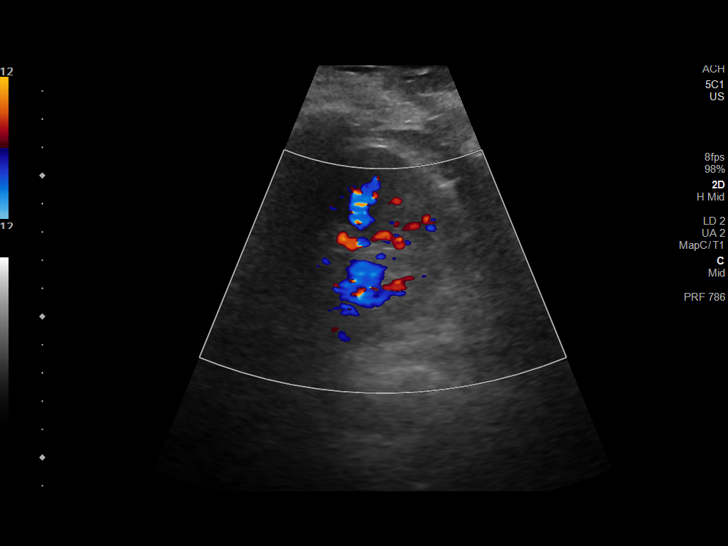
[im 31/38]
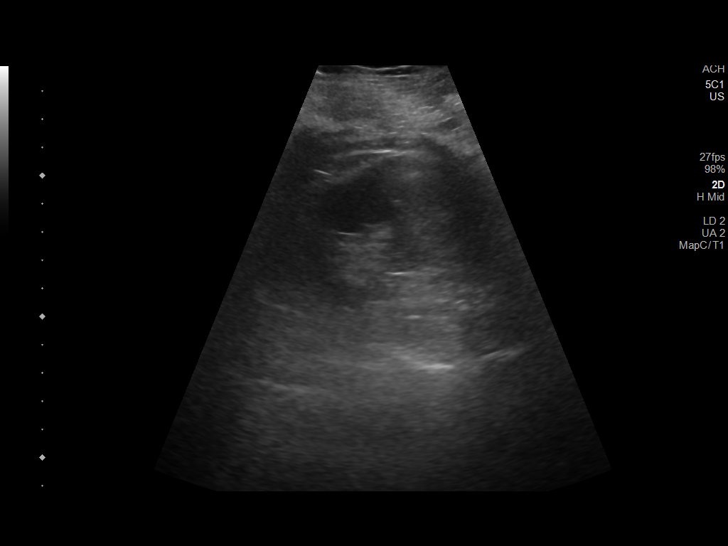
[im 34/38]
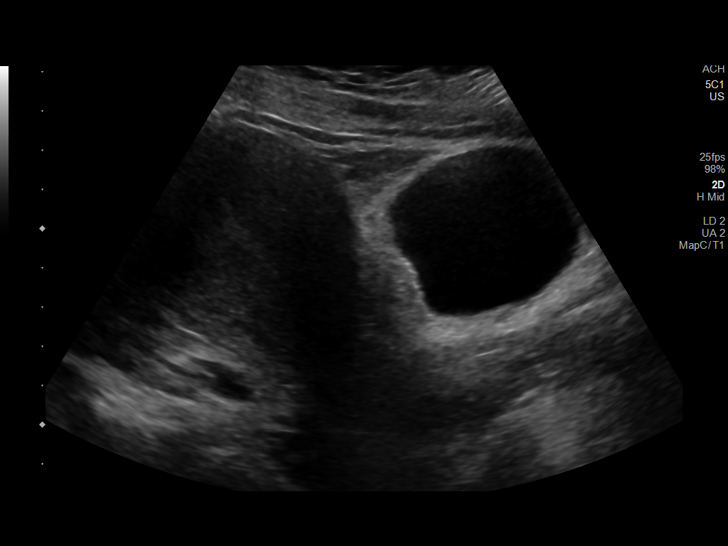
[im 38/38]
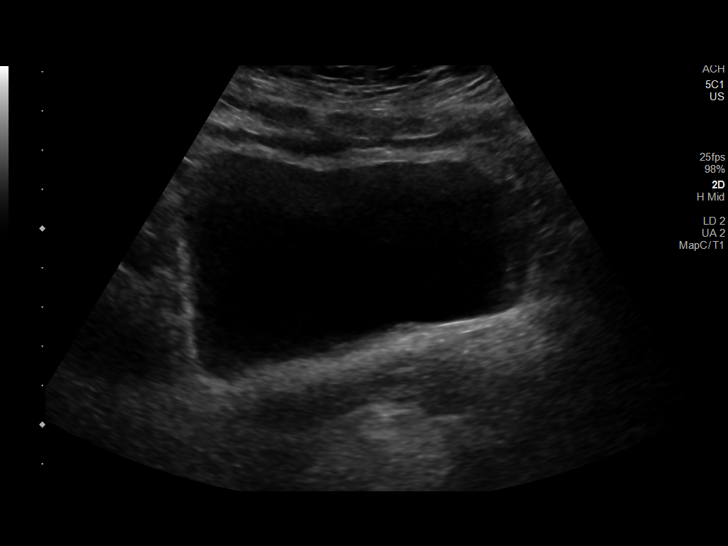

[14 of 25 positions shown; findings below may reference images not displayed]

FINDINGS: Right Kidney:

Renal measurements: 11.8 x 5.3 by 6.0 cm = volume: 197 mL .
Echogenicity within normal limits. No mass or hydronephrosis
visualized.

Left Kidney:

Renal measurements: 11.4 x 7.4 x 7.2 cm = volume: 317 mL.
Echogenicity within normal limits. No mass or hydronephrosis
visualized.

Bladder:

Appears normal for degree of bladder distention.

Other:

None.
IMPRESSION: 1. Unremarkable renal ultrasound.
